# Patient Record
Sex: Male | Born: 1960 | Race: Asian | Hispanic: No | Marital: Married | Smoking: Current every day smoker
Health system: Southern US, Community
[De-identification: ages and names within clinical notes are randomized; demographics above are authoritative.]

## PROBLEM LIST (undated history)

## (undated) DIAGNOSIS — E119 Type 2 diabetes mellitus without complications: Secondary | ICD-10-CM

## (undated) DIAGNOSIS — I10 Essential (primary) hypertension: Secondary | ICD-10-CM

## (undated) DIAGNOSIS — E785 Hyperlipidemia, unspecified: Secondary | ICD-10-CM

## (undated) DIAGNOSIS — F17200 Nicotine dependence, unspecified, uncomplicated: Secondary | ICD-10-CM

## (undated) DIAGNOSIS — A809 Acute poliomyelitis, unspecified: Secondary | ICD-10-CM

## (undated) HISTORY — DX: Nicotine dependence, unspecified, uncomplicated: F17.200

## (undated) HISTORY — DX: Acute poliomyelitis, unspecified: A80.9

## (undated) HISTORY — DX: Type 2 diabetes mellitus without complications: E11.9

## (undated) HISTORY — DX: Essential (primary) hypertension: I10

## (undated) HISTORY — DX: Hyperlipidemia, unspecified: E78.5

---

## 2020-05-19 ENCOUNTER — Ambulatory Visit (INDEPENDENT_AMBULATORY_CARE_PROVIDER_SITE_OTHER): Payer: Self-pay | Admitting: Internal Medicine

## 2020-05-19 ENCOUNTER — Encounter: Payer: Self-pay | Admitting: Internal Medicine

## 2020-05-19 ENCOUNTER — Other Ambulatory Visit: Payer: Self-pay

## 2020-05-19 DIAGNOSIS — R079 Chest pain, unspecified: Secondary | ICD-10-CM

## 2020-05-19 NOTE — Progress Notes (Addendum)
Name: Bryan White MRN: [id number] DOB: [birth year]      I connected with the patient by telephone enabled telemedicine visit and verified that I am speaking with the correct person using two identifiers.    I discussed the limitations, risks, security and privacy concerns of performing an evaluation and management service by telemedicine and the availability of in-person appointments. I also discussed with the patient that there may be a patient responsible charge related to this service. The patient expressed understanding and agreed to proceed.  PATIENT AGREES AND CONFIRMS -YES   Other persons participating in the visit and their role in the encounter: Patient, nursing  This visit type was conducted due to national recommendations for restrictions regarding the COVID-19 Pandemic (e.g. social distancing).  This format is felt to be most appropriate for this patient at this time.  All issues noted in this document were discussed and addressed.     Patient at home Physician at office  Patient is unable to attend office visit due to his disability and family not available to bring him to office visit   CONSULTATION DATE: 05/19/2020 CHIEF COMPLAINT: Cough and SOB    HISTORY OF PRESENT ILLNESS: 60 yo [place] male with long standing smoking history with intermittent cough and some SOB  Patient is active smoker Has been having some SOB and Cough for many years Patient is willing to stop smoking but he is very concerned about lung damage.  Patient wants CT scan  Patient does have a history of polio with disability requiring wheelchair   PAST MEDICAL HISTORY :   has a past medical history of Diabetes (HCC), Hyperlipidemia, Hypertension, Polio, and Smoker.  has no past surgical history on file. Prior to Admission medications   Not on File    FAMILY HISTORY:  family history includes Diabetes in his mother. SOCIAL HISTORY:  reports that he has been smoking  cigarettes. He has a 40.00 pack-year smoking history. He has never used smokeless tobacco. He reports current alcohol use. He reports that he does not use drugs.    Review of Systems:  Gen:  Denies  fever, sweats, chills weigh loss  HEENT: Denies blurred vision, double vision, ear pain, eye pain, hearing loss, nose bleeds, sore throat Cardiac:  No dizziness, chest pain or heaviness, chest tightness,edema, No JVD Resp: + cough, -sputum production, +shortness of breath,-wheezing, -hemoptysis,  Gi: Denies swallowing difficulty, stomach pain, nausea or vomiting, diarrhea, constipation, bowel incontinence Gu:  Denies bladder incontinence, burning urine Ext:   Denies Joint pain, stiffness or swelling Skin: Denies  skin rash, easy bruising or bleeding or hives Endoc:  Denies polyuria, polydipsia , polyphagia or weight change Psych:   Denies depression, insomnia or hallucinations  Other:  All other systems negative     MEDICATIONS: I have reviewed all medications and confirmed regimen as documented       ASSESSMENT AND PLAN SYNOPSIS  60 yo [place] male with extensive smoking history with intermittent cough. Patient has high risk for developing malignancy and will obtain CT chest to assess for lung disease, patient is requesting CT scan at this time  COVID-19 EDUCATION: The signs and symptoms of COVID-19 were discussed with the patient and how to seek care for testing.  The importance of social distancing was discussed today. Hand Washing Techniques and avoid touching face was advised.     MEDICATION ADJUSTMENTS/LABS AND TESTS ORDERED: Ct chest as per patient request    CURRENT MEDICATIONS REVIEWED AT LENGTH WITH  PATIENT TODAY   Patient satisfied with Plan of action and management. All questions answered  Follow up as needed  Total time spent 18 minutes   Lucie Leather, M.D.  [facility]  Medical Director [facility] Medical  Director [facility] Department

## 2020-05-19 NOTE — Patient Instructions (Signed)
CT chest to assess lung disease

## 2020-05-21 ENCOUNTER — Ambulatory Visit
Admission: RE | Admit: 2020-05-21 | Discharge: 2020-05-21 | Disposition: A | Discharge status: Home / Self Care | Payer: Self-pay | Source: Ambulatory Visit | Attending: Internal Medicine | Admitting: Internal Medicine

## 2020-05-21 ENCOUNTER — Other Ambulatory Visit: Payer: Self-pay

## 2020-05-21 DIAGNOSIS — R079 Chest pain, unspecified: Secondary | ICD-10-CM | POA: Insufficient documentation

## 2022-04-20 IMAGING — CT CT CHEST W/O CM
2 of 3 series · 15 of 36 positions shown, 18 images · non-contrast
Comparison: None.

CLINICAL DATA: 59-year-old male with respiratory illness.  Smoker.

EXAM:
CT CHEST WITHOUT CONTRAST
TECHNIQUE: Multidetector CT imaging of the chest was performed following the
standard protocol without IV contrast.

[Series 2: thorax · axial · 0.71mm/px · z∈[-247,+13]mm · 12 of 154 slices shown, 15 images]
[im 12/154  mediastinal]
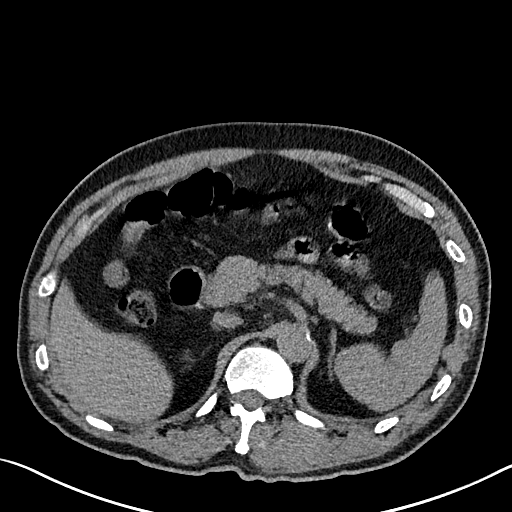
[im 12/154  lung]
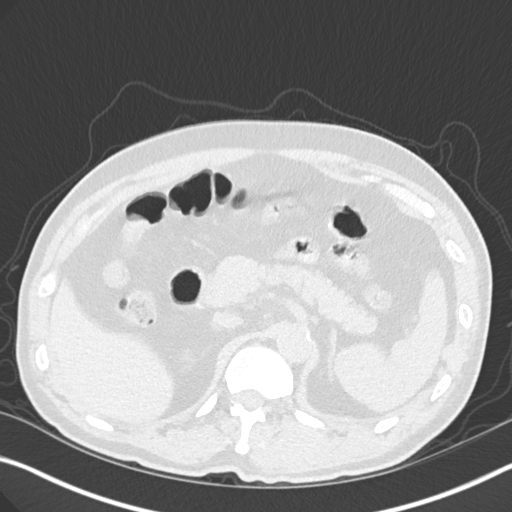
[im 23/154  lung]
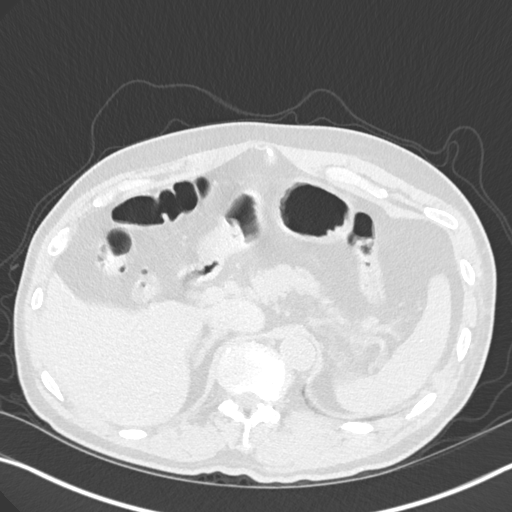
[im 35/154  lung]
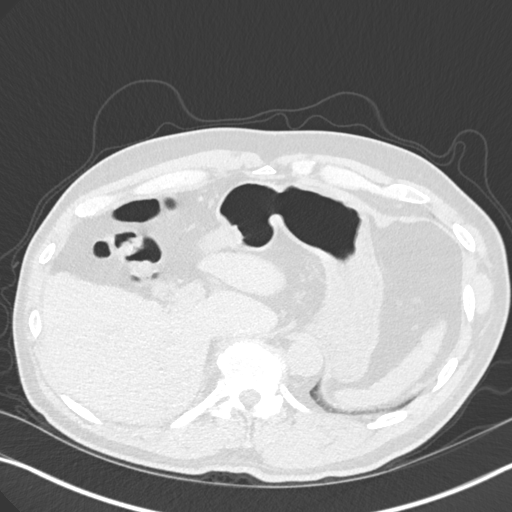
[im 46/154  lung]
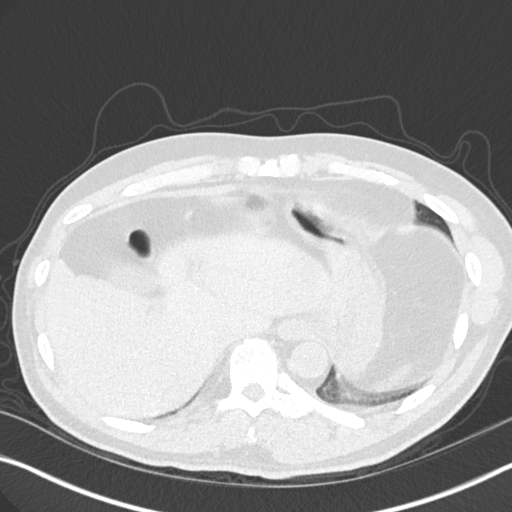
[im 57/154  mediastinal]
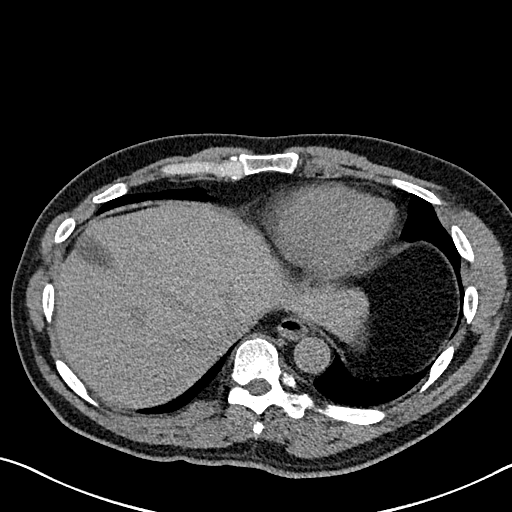
[im 57/154  lung]
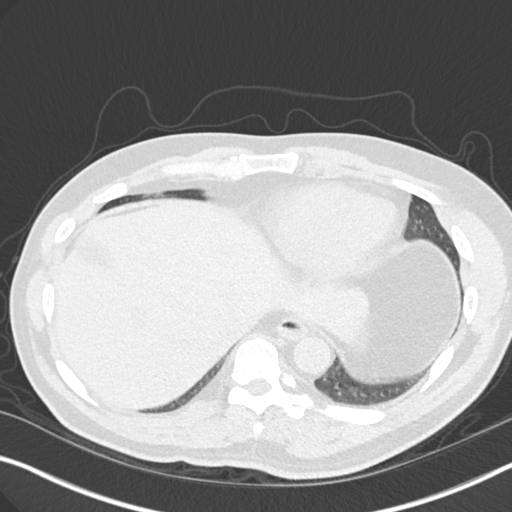
[im 69/154  lung]
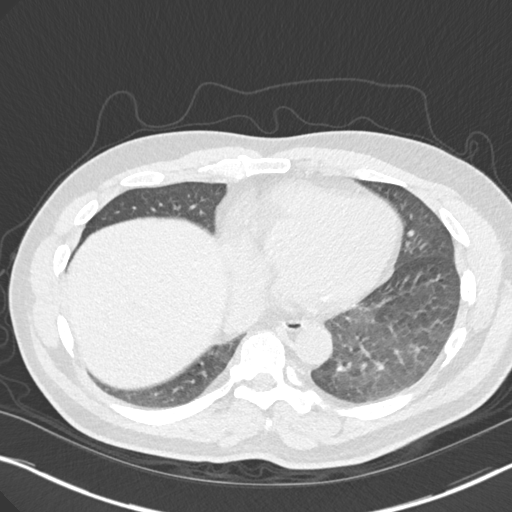
[im 86/154  lung]
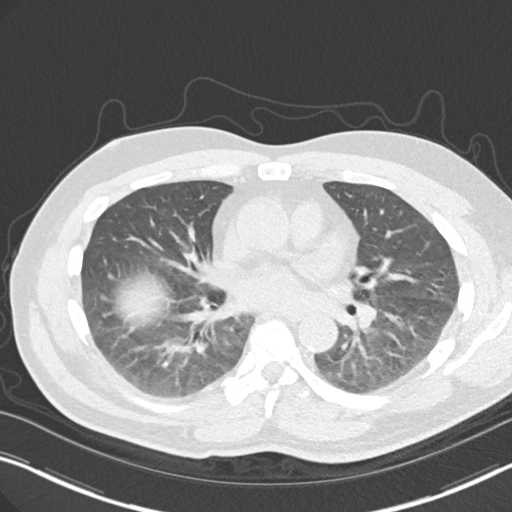
[im 97/154  lung]
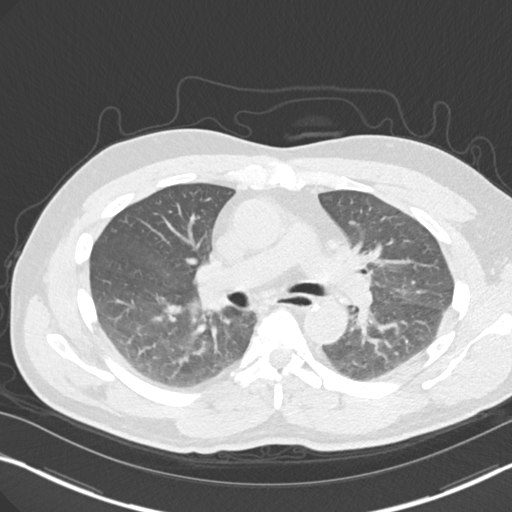
[im 108/154  mediastinal]
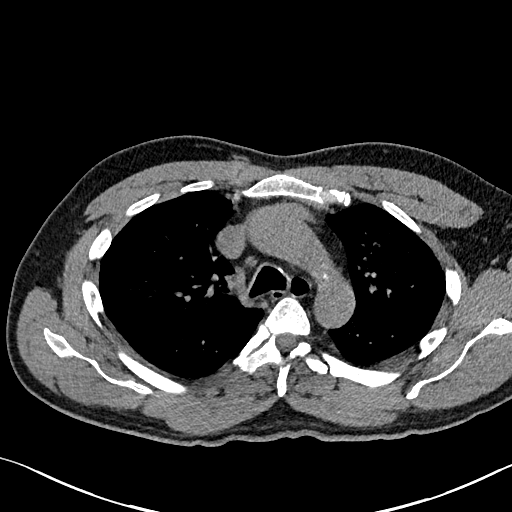
[im 108/154  lung]
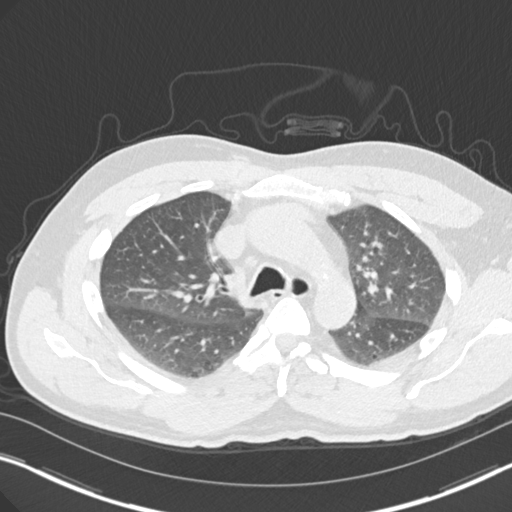
[im 120/154  lung]
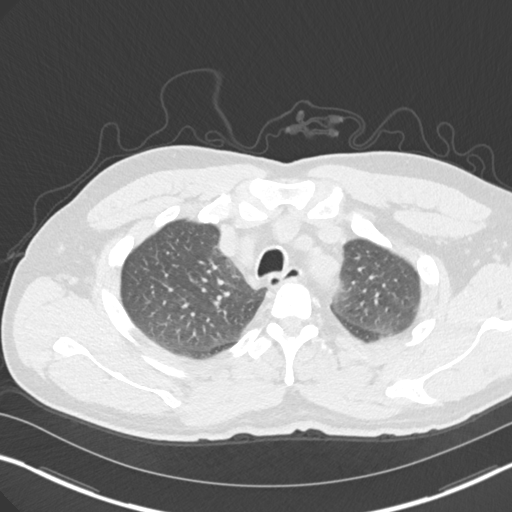
[im 131/154  lung]
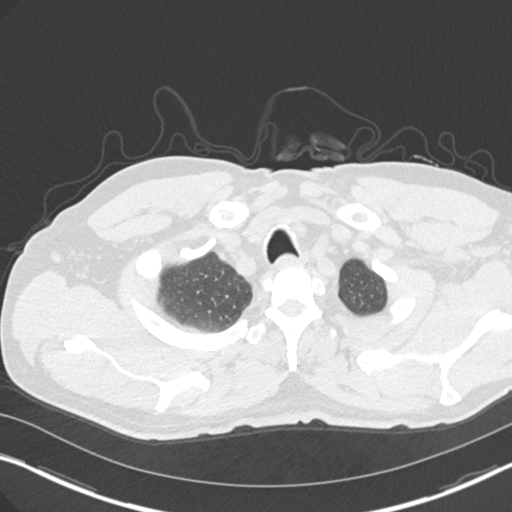
[im 142/154  lung]
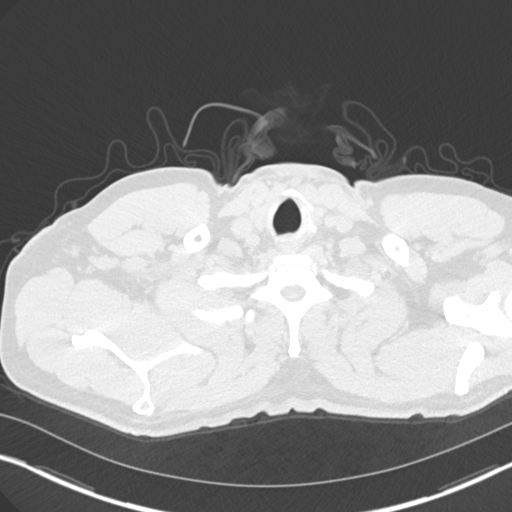

[Series 5: coronal · coronal · 0.69mm/px · 3 of 145 slices shown]
[im 29/145  lung]
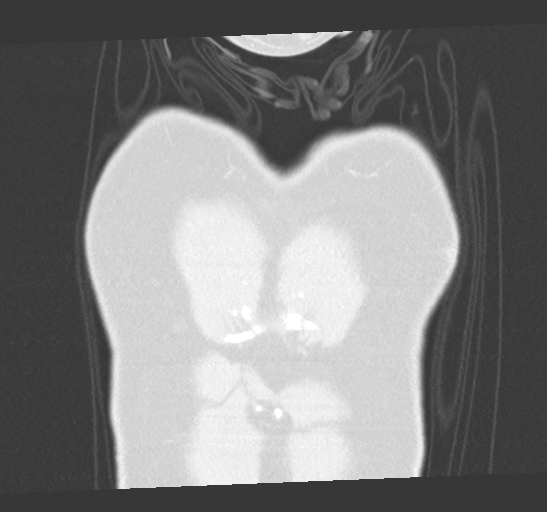
[im 58/145  lung]
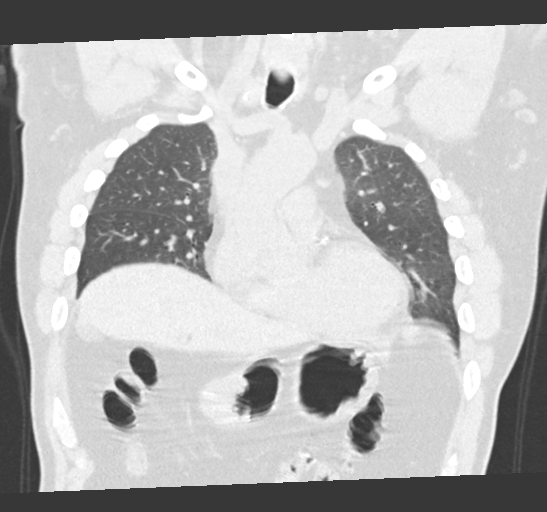
[im 87/145  lung]
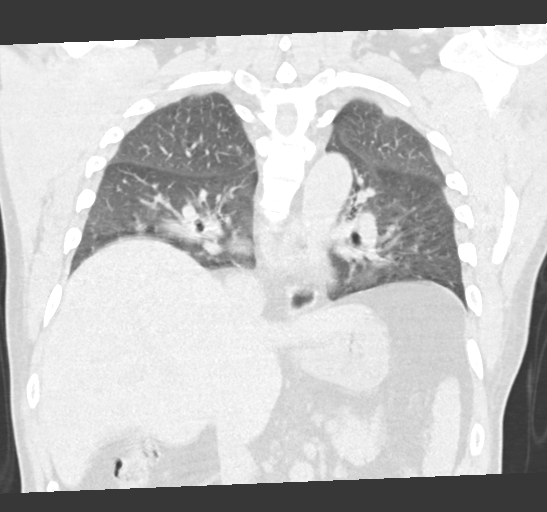

[15 of 36 positions shown; findings below may reference images not displayed]

FINDINGS: Cardiovascular: Calcified coronary artery atherosclerosis appears
extensive on series 2, image 73. No cardiomegaly or pericardial
effusion. Comparatively mild Calcified aortic atherosclerosis.
Vascular patency is not evaluated in the absence of IV contrast.

Mediastinum/Nodes: Negative; mediastinal lymph nodes remain within
normal limits.

Visible thyroid is negative for age, subcentimeter right lower lobe
calcified nodule Not clinically significant; no follow-up imaging
recommended (ref: [HOSPITAL]. [DATE]): 143-50).

Lungs/Pleura: Major airways are patent. Symmetric mild bilateral
pulmonary ground-glass opacity, somewhat dependent. No pulmonary
nodule, consolidation, or pleural effusion. There is respiratory
motion at the left costophrenic angle.

Upper Abdomen: Negative visible noncontrast liver, gallbladder,
spleen, pancreas, adrenal glands and bowel in the upper abdomen.

Musculoskeletal: Mild dextro thoracic scoliosis. No acute osseous
abnormality identified.
IMPRESSION: 1. Low lung volumes with symmetric pulmonary ground-glass opacity
might simply reflect atelectasis. Viral/atypical respiratory
infection is difficult to exclude.
2. No other acute finding in the Chest.
3. Calcified coronary artery and Aortic Atherosclerosis
([id number]).
